# Patient Record
Sex: Male | Born: 1975 | Race: White | Hispanic: No | Marital: Single | Smoking: Never smoker
Health system: Southern US, Community
[De-identification: ages and names within clinical notes are randomized; demographics above are authoritative.]

## PROBLEM LIST (undated history)

## (undated) ENCOUNTER — Ambulatory Visit (HOSPITAL_COMMUNITY): Payer: BC Managed Care – PPO

## (undated) DIAGNOSIS — G4730 Sleep apnea, unspecified: Secondary | ICD-10-CM

## (undated) DIAGNOSIS — I10 Essential (primary) hypertension: Secondary | ICD-10-CM

## (undated) DIAGNOSIS — Z5189 Encounter for other specified aftercare: Secondary | ICD-10-CM

## (undated) HISTORY — PX: ROTATOR CUFF REPAIR: SHX139

---

## 2005-09-27 ENCOUNTER — Emergency Department (HOSPITAL_COMMUNITY): Admission: EM | Admit: 2005-09-27 | Discharge: 2005-09-28 | Payer: Self-pay | Admitting: Emergency Medicine

## 2007-12-25 ENCOUNTER — Emergency Department (HOSPITAL_COMMUNITY): Admission: EM | Admit: 2007-12-25 | Discharge: 2007-12-25 | Payer: Self-pay | Admitting: Emergency Medicine

## 2009-03-31 ENCOUNTER — Emergency Department (HOSPITAL_COMMUNITY): Admission: EM | Admit: 2009-03-31 | Discharge: 2009-03-31 | Payer: Self-pay | Admitting: Emergency Medicine

## 2009-09-23 IMAGING — CR DG CHEST 2V
2 series · 2 of 2 positions shown · non-contrast
Comparison: None

CLINICAL DATA: Chest tightness for 1 week.  Hypertension.  Chest
pain.

CHEST - 2 VIEW

[view not recorded (1 of 2)]
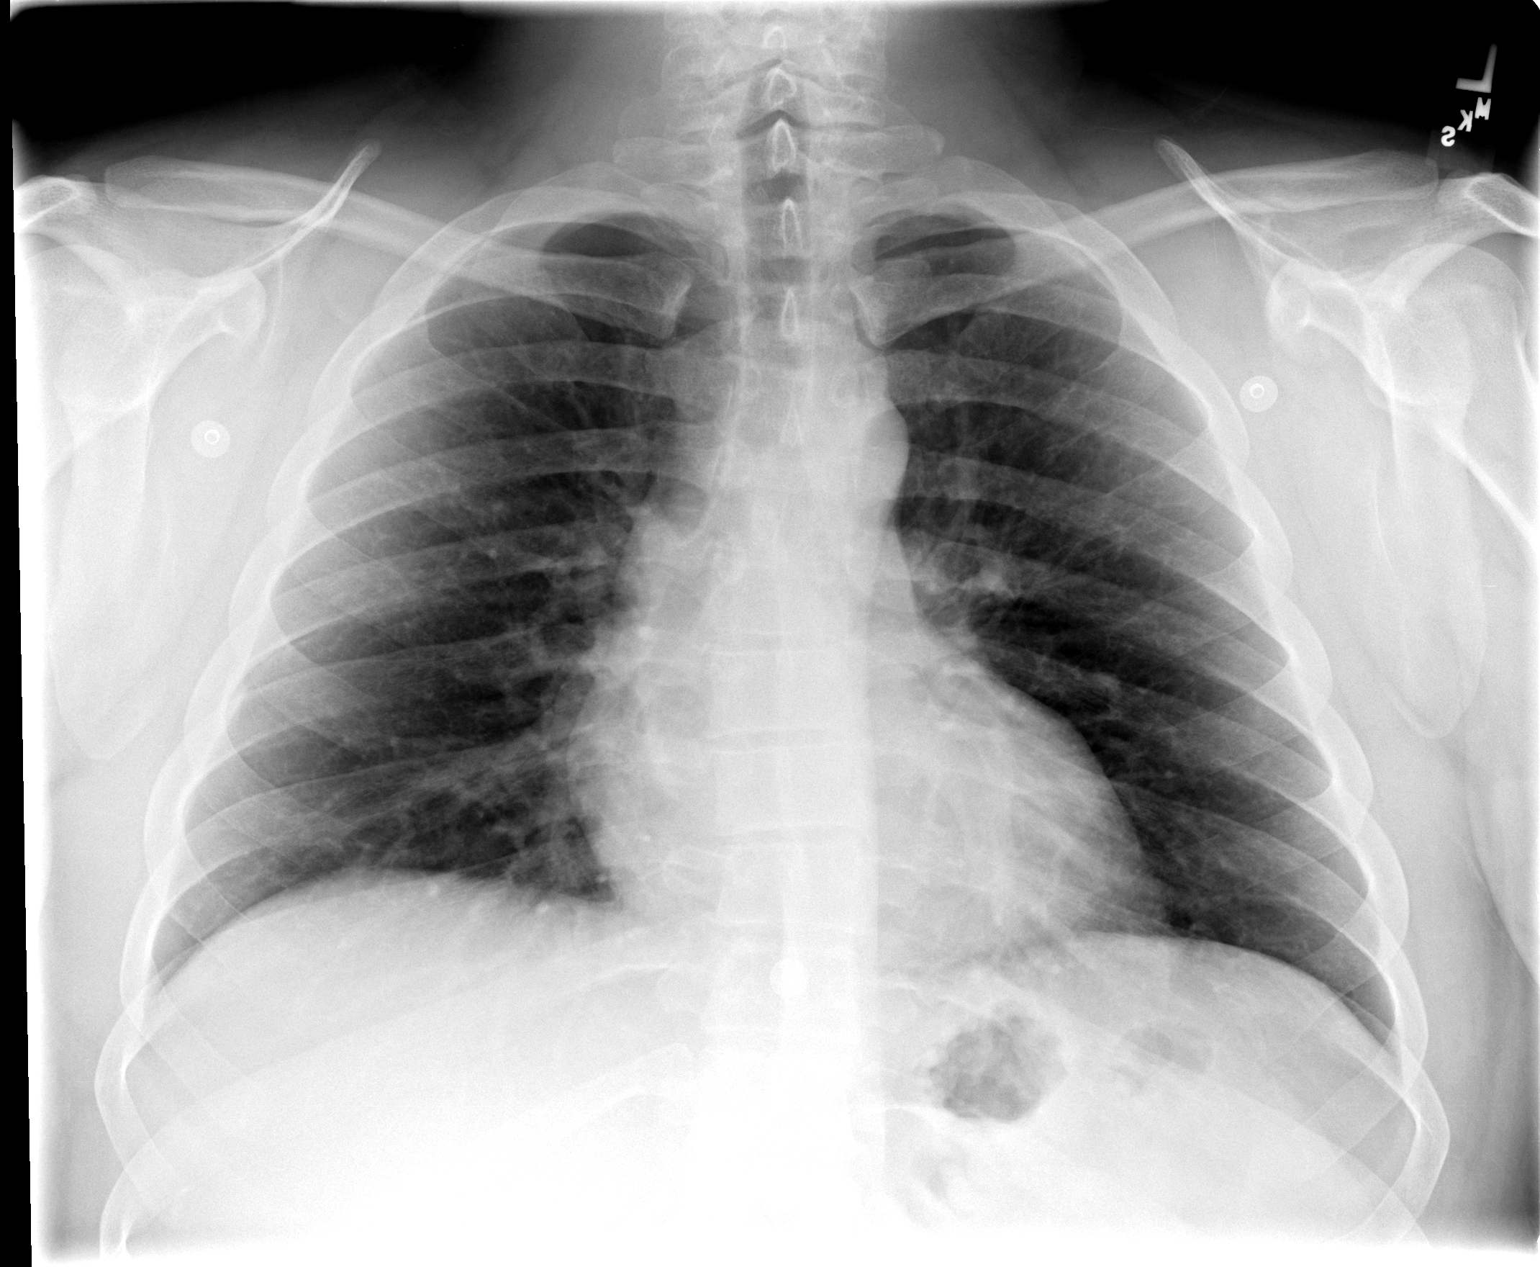

[view not recorded (2 of 2)]
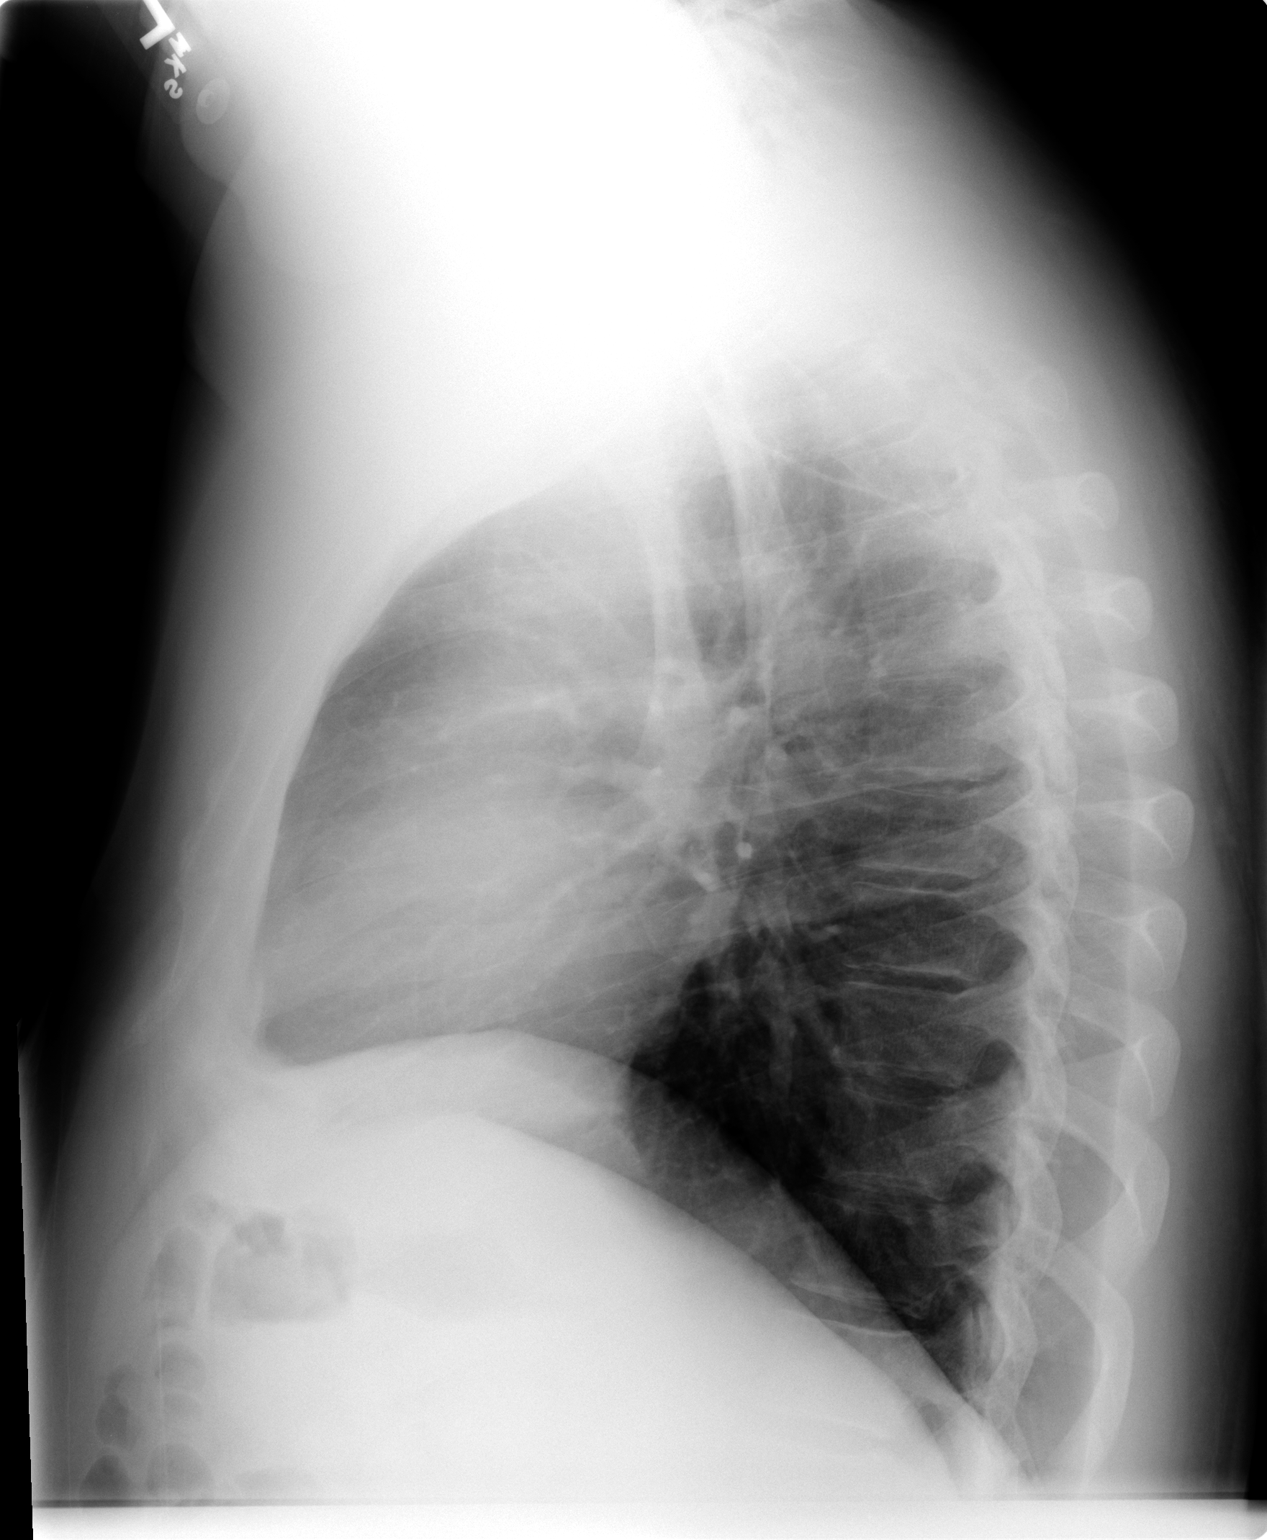

[2 of 2 positions shown; findings below may reference images not displayed]

FINDINGS: Mild airway thickening suggest bronchitis or reactive
airways disease.

Cardiac and mediastinal contours appear unremarkable.

No airspace opacity is identified.
IMPRESSION: 1. Airway thickening may reflect bronchitis or reactive airways
disease.  No airspace opacity is identified to suggest bacterial
pneumonia pattern.

## 2009-10-26 ENCOUNTER — Encounter: Admission: RE | Admit: 2009-10-26 | Discharge: 2009-10-26 | Payer: Self-pay | Admitting: Emergency Medicine

## 2010-01-29 ENCOUNTER — Ambulatory Visit (HOSPITAL_BASED_OUTPATIENT_CLINIC_OR_DEPARTMENT_OTHER): Admission: RE | Admit: 2010-01-29 | Discharge: 2010-01-29 | Payer: Self-pay | Admitting: Urology

## 2010-05-25 LAB — POCT HEMOGLOBIN-HEMACUE: Hemoglobin: 15.3 g/dL (ref 13.0–17.0)

## 2010-12-13 LAB — DIFFERENTIAL
Basophils Absolute: 0
Eosinophils Absolute: 0
Neutro Abs: 6.1
Neutrophils Relative %: 66

## 2010-12-13 LAB — CBC
HCT: 45.3
Hemoglobin: 15.8
MCHC: 34.8
MCV: 86.1
Platelets: 288
RBC: 5.27
RDW: 12.9

## 2010-12-13 LAB — BASIC METABOLIC PANEL
BUN: 8
CO2: 26
Chloride: 102
Creatinine, Ser: 1.06

## 2010-12-13 LAB — POCT CARDIAC MARKERS
CKMB, poc: 1.7
Myoglobin, poc: 67.4
Troponin i, poc: <0.05
Troponin i, poc: <0.05
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
## 2015-07-06 ENCOUNTER — Other Ambulatory Visit: Payer: Self-pay | Admitting: Sports Medicine

## 2015-07-06 DIAGNOSIS — M25512 Pain in left shoulder: Secondary | ICD-10-CM

## 2015-07-06 DIAGNOSIS — Z77018 Contact with and (suspected) exposure to other hazardous metals: Secondary | ICD-10-CM

## 2015-07-14 ENCOUNTER — Ambulatory Visit
Admission: RE | Admit: 2015-07-14 | Discharge: 2015-07-14 | Disposition: A | Discharge status: Home / Self Care | Payer: BC Managed Care – PPO | Source: Ambulatory Visit | Attending: Sports Medicine | Admitting: Sports Medicine

## 2015-07-14 DIAGNOSIS — M25512 Pain in left shoulder: Secondary | ICD-10-CM

## 2015-07-14 DIAGNOSIS — Z77018 Contact with and (suspected) exposure to other hazardous metals: Secondary | ICD-10-CM

## 2015-07-20 ENCOUNTER — Other Ambulatory Visit: Payer: Self-pay | Admitting: Sports Medicine

## 2015-07-20 ENCOUNTER — Ambulatory Visit
Admission: RE | Admit: 2015-07-20 | Discharge: 2015-07-20 | Disposition: A | Discharge status: Home / Self Care | Payer: BC Managed Care – PPO | Source: Ambulatory Visit | Attending: Sports Medicine | Admitting: Sports Medicine

## 2015-07-20 ENCOUNTER — Ambulatory Visit: Admission: RE | Admit: 2015-07-20 | Payer: BC Managed Care – PPO | Source: Ambulatory Visit

## 2015-07-20 DIAGNOSIS — M25512 Pain in left shoulder: Secondary | ICD-10-CM

## 2015-12-08 ENCOUNTER — Ambulatory Visit (INDEPENDENT_AMBULATORY_CARE_PROVIDER_SITE_OTHER): Payer: BC Managed Care – PPO | Admitting: Urology

## 2015-12-08 DIAGNOSIS — F524 Premature ejaculation: Secondary | ICD-10-CM

## 2015-12-08 DIAGNOSIS — R102 Pelvic and perineal pain: Secondary | ICD-10-CM | POA: Diagnosis not present

## 2018-03-14 HISTORY — PX: ARTHOSCOPIC ROTAOR CUFF REPAIR: SHX5002

## 2018-11-15 ENCOUNTER — Other Ambulatory Visit: Payer: Self-pay | Admitting: Orthopedic Surgery

## 2018-11-21 ENCOUNTER — Other Ambulatory Visit: Payer: Self-pay | Admitting: Orthopedic Surgery

## 2018-11-21 ENCOUNTER — Ambulatory Visit
Admission: RE | Admit: 2018-11-21 | Discharge: 2018-11-21 | Disposition: A | Discharge status: Home / Self Care | Payer: No Typology Code available for payment source | Source: Ambulatory Visit | Attending: Orthopedic Surgery | Admitting: Orthopedic Surgery

## 2018-11-21 DIAGNOSIS — G8929 Other chronic pain: Secondary | ICD-10-CM

## 2018-11-21 DIAGNOSIS — T1590XA Foreign body on external eye, part unspecified, unspecified eye, initial encounter: Secondary | ICD-10-CM

## 2018-11-24 ENCOUNTER — Ambulatory Visit
Admission: RE | Admit: 2018-11-24 | Discharge: 2018-11-24 | Disposition: A | Discharge status: Home / Self Care | Payer: BC Managed Care – PPO | Source: Ambulatory Visit | Attending: Orthopedic Surgery | Admitting: Orthopedic Surgery

## 2018-11-24 ENCOUNTER — Other Ambulatory Visit: Payer: Self-pay

## 2018-11-24 DIAGNOSIS — G8929 Other chronic pain: Secondary | ICD-10-CM

## 2019-01-08 ENCOUNTER — Other Ambulatory Visit (HOSPITAL_BASED_OUTPATIENT_CLINIC_OR_DEPARTMENT_OTHER): Payer: Self-pay

## 2019-01-08 DIAGNOSIS — R0683 Snoring: Secondary | ICD-10-CM

## 2019-01-08 DIAGNOSIS — R5383 Other fatigue: Secondary | ICD-10-CM

## 2019-01-11 ENCOUNTER — Other Ambulatory Visit: Payer: Self-pay

## 2019-01-11 ENCOUNTER — Other Ambulatory Visit (HOSPITAL_COMMUNITY)
Admission: RE | Admit: 2019-01-11 | Discharge: 2019-01-11 | Disposition: A | Discharge status: Home / Self Care | Payer: BC Managed Care – PPO | Source: Ambulatory Visit | Attending: Neurology | Admitting: Neurology

## 2019-01-11 DIAGNOSIS — Z01812 Encounter for preprocedural laboratory examination: Secondary | ICD-10-CM | POA: Diagnosis not present

## 2019-01-11 DIAGNOSIS — Z20828 Contact with and (suspected) exposure to other viral communicable diseases: Secondary | ICD-10-CM | POA: Insufficient documentation

## 2019-01-11 LAB — SARS CORONAVIRUS 2 (TAT 6-24 HRS): SARS Coronavirus 2: NEGATIVE

## 2019-01-14 ENCOUNTER — Other Ambulatory Visit: Payer: Self-pay

## 2019-01-14 ENCOUNTER — Ambulatory Visit: Payer: No Typology Code available for payment source | Attending: *Deleted | Admitting: Neurology

## 2019-01-14 DIAGNOSIS — R5383 Other fatigue: Secondary | ICD-10-CM

## 2019-01-14 DIAGNOSIS — R0683 Snoring: Secondary | ICD-10-CM

## 2019-01-14 DIAGNOSIS — G4733 Obstructive sleep apnea (adult) (pediatric): Secondary | ICD-10-CM | POA: Diagnosis not present

## 2019-01-17 NOTE — Procedures (Signed)
  [place] A. Merlene Laughter, MD     [email or web address]             NOCTURNAL POLYSOMNOGRAPHY   LOCATION: [facility]  Patient Name: Dennis, Perez Date: 01/14/2019 Gender: Male D.O.B: 12-Aug-1975 Age (years): 27 Referring Provider: Everardo Beals NP Height (inches): 66 Interpreting Physician: Phillips Odor MD, ABSM Weight (lbs): 310 RPSGT: Rosebud Poles BMI: 50 MRN: [id number] Neck Size: 21.00 CLINICAL INFORMATION  Sleep Study Type: NPSG     Indication for sleep study: Fatigue, Snoring     Epworth Sleepiness Score: 15    SLEEP STUDY TECHNIQUE  As per the AASM Manual for the Scoring of Sleep and Associated Events v2.3 (April 2016) with a hypopnea requiring 4% desaturations.  The channels recorded and monitored were frontal, central and occipital EEG, electrooculogram (EOG), submentalis EMG (chin), nasal and oral airflow, thoracic and abdominal wall motion, anterior tibialis EMG, snore microphone, electrocardiogram, and pulse oximetry.   MEDICATIONS Medications self-administered by patient taken the night of the study : N/A No current outpatient medications on file.    SLEEP ARCHITECTURE  The study was initiated at 10:25:50 PM and ended at 4:54:24 AM.  Sleep onset time was 33.2 minutes and the sleep efficiency was 79.0%%. The total sleep time was 307 minutes.  Stage REM latency was 103.5 minutes.  The patient spent 7.8%% of the night in stage N1 sleep, 51.5%% in stage N2 sleep, 23.6%% in stage N3 and 17.1% in REM.  Alpha intrusion was absent.  Supine sleep was 70.71%. RESPIRATORY PARAMETERS  The overall apnea/hypopnea index (AHI) was 15.4 per hour. There were 0 total apneas, including 0 obstructive, 0 central and 0 mixed apneas. There were 79 hypopneas and 0 RERAs.  The AHI during Stage REM sleep was 46.9 per hour.  AHI while supine was 20.5 per hour.  The mean oxygen saturation was 93.4%. The minimum SpO2 during sleep was  71.0%.  loud snoring was noted during this study.   CARDIAC DATA The 2 lead EKG demonstrated sinus rhythm. The mean heart rate was 70.3 beats per minute. Other EKG findings include: None.  LEG MOVEMENT DATA The total PLMS were 13 with a resulting PLMS index of 2.5. Associated arousal with leg movement index was 2.7.   IMPRESSIONS 1.  Severe obstructive sleep apnea syndrome is documented with this study.  AutoPap 10-20 is recommended.  Delano Metz, MD Diplomate, American Board of Sleep Medicine.  ELECTRONICALLY SIGNED ON:  01/17/2019, 11:13 PM [place] PH: [phone]   FX: [phone] [place]

## 2021-03-08 ENCOUNTER — Ambulatory Visit
Admission: RE | Admit: 2021-03-08 | Discharge: 2021-03-08 | Disposition: A | Discharge status: Home / Self Care | Payer: BC Managed Care – PPO | Source: Ambulatory Visit

## 2021-03-08 ENCOUNTER — Other Ambulatory Visit: Payer: Self-pay

## 2021-03-08 VITALS — BP 166/105 | HR 72 | Temp 97.7°F | Resp 20

## 2021-03-08 DIAGNOSIS — H6983 Other specified disorders of Eustachian tube, bilateral: Secondary | ICD-10-CM | POA: Diagnosis not present

## 2021-03-08 DIAGNOSIS — J3489 Other specified disorders of nose and nasal sinuses: Secondary | ICD-10-CM

## 2021-03-08 DIAGNOSIS — J3089 Other allergic rhinitis: Secondary | ICD-10-CM

## 2021-03-08 DIAGNOSIS — R0981 Nasal congestion: Secondary | ICD-10-CM

## 2021-03-08 HISTORY — DX: Encounter for other specified aftercare: Z51.89

## 2021-03-08 MED ORDER — CETIRIZINE HCL 10 MG PO TABS: 10.0000 mg | ORAL_TABLET | Freq: Every day | ORAL | 2 refills | Status: AC

## 2021-03-08 MED ORDER — DEXAMETHASONE SODIUM PHOSPHATE 10 MG/ML IJ SOLN
10.0000 mg | Freq: Once | INTRAMUSCULAR | Status: AC
  Administered 2021-03-08: 18:00:00 10 mg via INTRAMUSCULAR

## 2021-03-08 MED ORDER — FLUTICASONE PROPIONATE 50 MCG/ACT NA SUSP: 1.0000 | Freq: Two times a day (BID) | NASAL | 2 refills | Status: AC

## 2021-03-08 NOTE — ED Provider Notes (Signed)
[facility]    CSN: [id number] Arrival date & time: 03/08/21  1647      History   Chief Complaint Chief Complaint  Patient presents with   Nasal Congestion   Facial Pain   Cough    HPI Dennis Perez is a 45 y.o. male.   Presenting today with 3 to 4-day history of sinus pressure, runny nose, itchy watery eyes, cough, postnasal drip, ear pressure and fullness.  Denies fever, chills, chest pain, shortness of breath, abdominal pain, nausea vomiting or diarrhea.  States he gets something like this every year around this time.  No known sick contacts recently.  Past Medical History:  Diagnosis Date   Blood transfusion without reported diagnosis     There are no problems to display for this patient.   History reviewed. No pertinent surgical history.   Home Medications    Prior to Admission medications   Medication Sig Start Date End Date Taking? Authorizing Provider  cetirizine (ZYRTEC ALLERGY) 10 MG tablet Take 1 tablet (10 mg total) by mouth daily. 03/08/21  Yes Particia Nearing, PA-C  fluticasone [facility]) 50 MCG/ACT nasal spray Place 1 spray into both nostrils 2 (two) times daily. 03/08/21  Yes Particia Nearing, PA-C  lisinopril (ZESTRIL) 10 MG tablet lisinopril 10 mg tablet    [provider]    Family History Family History  Family history unknown: Yes    Social History Social History   Tobacco Use   Smoking status: Never   Smokeless tobacco: Never  Substance Use Topics   Alcohol use: Not Currently     Allergies   Penicillins   Review of Systems Review of Systems Per HPI  Physical Exam Triage Vital Signs ED Triage Vitals  Enc Vitals Group     BP 03/08/21 1730 (!) 166/105     Pulse Rate 03/08/21 1730 72     Resp 03/08/21 1730 20     Temp 03/08/21 1730 97.7 F (36.5 C)     Temp src --      SpO2 03/08/21 1730 97 %     Weight --      Height --      Head Circumference --      Peak Flow --      Pain Score  03/08/21 1727 3     Pain Loc --      Pain Edu? --      Excl. in GC? --    No data found.  Updated Vital Signs BP (!) 166/105    Pulse 72    Temp 97.7 F (36.5 C)    Resp 20    SpO2 97%   Visual Acuity Right Eye Distance:   Left Eye Distance:   Bilateral Distance:    Right Eye Near:   Left Eye Near:    Bilateral Near:     Physical Exam Vitals and nursing note reviewed.  Constitutional:      Appearance: He is well-developed.  HENT:     Head: Atraumatic.     Right Ear: External ear normal.     Left Ear: External ear normal.     Ears:     Comments: Bilateral middle ear effusion    Nose: Rhinorrhea present.     Mouth/Throat:     Pharynx: Posterior oropharyngeal erythema present. No oropharyngeal exudate.  Eyes:     Conjunctiva/sclera: Conjunctivae normal.     Pupils: Pupils are equal, round, and reactive to light.  Cardiovascular:  Rate and Rhythm: Normal rate and regular rhythm.  Pulmonary:     Effort: Pulmonary effort is normal. No respiratory distress.     Breath sounds: No wheezing or rales.  Musculoskeletal:        General: Normal range of motion.     Cervical back: Normal range of motion and neck supple.  Lymphadenopathy:     Cervical: No cervical adenopathy.  Skin:    General: Skin is warm and dry.  Neurological:     Mental Status: He is alert and oriented to person, place, and time.  Psychiatric:        Behavior: Behavior normal.     UC Treatments / Results  Labs (all labs ordered are listed, but only abnormal results are displayed) Labs Reviewed - No data to display  EKG   Radiology No results found.  Procedures Procedures (including critical care time)  Medications Ordered in UC Medications  dexamethasone (DECADRON) injection 10 mg (has no administration in time range)    Initial Impression / Assessment and Plan / UC Course  I have reviewed the triage vital signs and the nursing notes.  Pertinent labs & imaging results that were  available during my care of the patient were reviewed by me and considered in my medical decision making (see chart for details).     Suspect allergic sinusitis.  We will treat with IM Decadron, start good allergy regimen with Zyrtec and Flonase.  Discussed supportive over-the-counter medications and home care.  Return for acutely worsening symptoms.  Final Clinical Impressions(s) / UC Diagnoses   Final diagnoses:  Seasonal allergic rhinitis due to other allergic trigger  Sinus pressure  Nasal congestion  Eustachian tube dysfunction, bilateral   Discharge Instructions   None    ED Prescriptions     Medication Sig Dispense Auth. Provider   fluticasone (FLONASE) 50 MCG/ACT nasal spray Place 1 spray into both nostrils 2 (two) times daily. 16 g Particia Nearing, [place]   cetirizine (ZYRTEC ALLERGY) 10 MG tablet Take 1 tablet (10 mg total) by mouth daily. 30 tablet Particia Nearing, [place]      PDMP not reviewed this encounter.   Particia Nearing, [place] 03/08/21 1756

## 2021-03-08 NOTE — ED Triage Notes (Signed)
Pt presents with c/o nasal congestion and cough that began Thursday also has has c/o ear fullness

## 2022-12-27 ENCOUNTER — Ambulatory Visit: Payer: Self-pay

## 2022-12-27 ENCOUNTER — Other Ambulatory Visit: Payer: Self-pay | Admitting: Family Medicine

## 2022-12-27 DIAGNOSIS — M25572 Pain in left ankle and joints of left foot: Secondary | ICD-10-CM

## 2022-12-27 DIAGNOSIS — M79672 Pain in left foot: Secondary | ICD-10-CM

## 2023-07-05 ENCOUNTER — Other Ambulatory Visit: Payer: Self-pay

## 2023-07-05 ENCOUNTER — Encounter (HOSPITAL_BASED_OUTPATIENT_CLINIC_OR_DEPARTMENT_OTHER): Payer: Self-pay | Admitting: Orthopaedic Surgery

## 2023-07-07 ENCOUNTER — Encounter (HOSPITAL_BASED_OUTPATIENT_CLINIC_OR_DEPARTMENT_OTHER)
Admission: RE | Admit: 2023-07-07 | Discharge: 2023-07-07 | Disposition: A | Discharge status: Home / Self Care | Payer: Self-pay | Source: Ambulatory Visit | Attending: Orthopaedic Surgery | Admitting: Orthopaedic Surgery

## 2023-07-07 DIAGNOSIS — Z01818 Encounter for other preprocedural examination: Secondary | ICD-10-CM | POA: Diagnosis present

## 2023-07-07 DIAGNOSIS — I10 Essential (primary) hypertension: Secondary | ICD-10-CM | POA: Diagnosis not present

## 2023-07-07 LAB — BASIC METABOLIC PANEL WITH GFR
Anion gap: 8 (ref 5–15)
BUN: 9 mg/dL (ref 6–20)
CO2: 23 mmol/L (ref 22–32)
Calcium: 8.7 mg/dL — ABNORMAL LOW (ref 8.9–10.3)
Chloride: 105 mmol/L (ref 98–111)
Creatinine, Ser: 0.91 mg/dL (ref 0.61–1.24)
GFR, Estimated: >60 mL/min (ref >60)
Glucose, Bld: 110 mg/dL — ABNORMAL HIGH (ref 70–99)
Potassium: 4.2 mmol/L (ref 3.5–5.1)
Sodium: 136 mmol/L (ref 135–145)

## 2023-07-09 NOTE — H&P (Signed)
 ORTHOPAEDIC SURGERY H&P  Subjective:  The patient presents with left ankle instability.   Past Medical History:  Diagnosis Date   Blood transfusion without reported diagnosis    Hypertension    Sleep apnea     Past Surgical History:  Procedure Laterality Date   ARTHOSCOPIC ROTAOR CUFF REPAIR Right 2020   ROTATOR CUFF REPAIR Left      (Not in an outpatient encounter)    Allergies  Allergen Reactions   Latex    Penicillins     Social History   Socioeconomic History   Marital status: Single    Spouse name: Not on file   Number of children: Not on file   Years of education: Not on file   Highest education level: Not on file  Occupational History   Not on file  Tobacco Use   Smoking status: Never   Smokeless tobacco: Never  Substance and Sexual Activity   Alcohol use: Not Currently    Comment: occ   Drug use: Never   Sexual activity: Yes  Other Topics Concern   Not on file  Social History Narrative   Not on file   Social Drivers of Health   Financial Resource Strain: Not on file  Food Insecurity: Not on file  Transportation Needs: Not on file  Physical Activity: Not on file  Stress: Not on file  Social Connections: Not on file  Intimate Partner Violence: Not on file     History reviewed. No pertinent family history.   Review of Systems Pertinent items are noted in HPI.  Objective: Vital signs in last 24 hours:    07/07/2023    7:30 AM 07/05/2023    4:16 PM 03/08/2021    5:30 PM  Vitals with BMI  Height  5\' 8"    Weight  180 lbs   BMI  27.38   Systolic 154  166  Diastolic 99  105  Pulse   72      EXAM: General: Well nourished, well developed. Awake, alert and oriented to time, place, person. Normal mood and affect. No apparent distress. Breathing room air.  Operative Lower Extremity: Alignment - Neutral Deformity - None Skin intact Tenderness to palpation - left ankle 5/5 TA, PT, GS, Per, EHL, FHL Sensation intact to light touch  throughout Palpable DP and PT pulses Special testing: None  The contralateral foot/ankle was examined for comparison and noted to be neurovascularly intact with no localized deformity, swelling, or tenderness.  Imaging Review All images taken were independently reviewed by me.  Assessment/Plan: The clinical and radiographic findings were reviewed and discussed at length with the patient.  The patient has left ankle instability.  We spoke at length about the natural course of these findings. We discussed nonoperative and operative treatment options in detail.  The risks and benefits were presented and reviewed. The risks due to hardware failure/irritation, new/persistent/recurrent infection, stiffness, nerve/vessel/tendon injury, nonunion/malunion of any fracture, wound healing issues, allograft usage, development of arthritis, failure of this surgery, possibility of external fixation in certain situations, possibility of delayed definitive surgery, need for further surgery, prolonged wound care including further soft tissue coverage procedures, thromboembolic events, anesthesia/medical complications/events perioperatively and beyond, amputation, death among others were discussed. The patient acknowledged the explanation and agreed to proceed with the plan.  Dennis Perez  Orthopaedic Surgery [facility]

## 2023-07-09 NOTE — Discharge Instructions (Signed)
 Netta Cedars, MD [facility]  Please read the following information regarding your care after surgery.  Medications  You only need a prescription for the narcotic pain medicine (ex. oxycodone, Percocet, Norco).  All of the other medicines listed below are available over the counter. ? Aleve 2 pills twice a day for the first 3 days after surgery. ? acetominophen (Tylenol) 650 mg every 4-6 hours as you need for minor to moderate pain ? oxycodone as prescribed for severe pain  ? To help prevent blood clots, take aspirin (81 mg) twice daily for at least 42 days after surgery.  You should also get up every hour while you are awake to move around.  Weight Bearing ? Do NOT bear any weight on the operated leg or foot. This means do NOT touch your surgical leg to the ground!  Cast / Splint / Dressing ? If you have a splint, do NOT remove this. Keep your splint, cast or dressing clean and dry.  Don't put anything (coat hanger, pencil, etc) down inside of it.  If it gets wet, call the office immediately to schedule an appointment for a cast change.  Swelling IMPORTANT: It is normal for you to have swelling where you had surgery. To reduce swelling and pain, keep at least 3 pillows under your leg so that your toes are above your nose and your heel is above the level of your hip.  It may be necessary to keep your foot or leg elevated for several weeks.  This is critical to helping your incisions heal and your pain to feel better.  Follow Up Call my office at [phone] when you are discharged from the hospital or surgery center to schedule an appointment to be seen 7-10 days after surgery.  Call my office at [phone] if you develop a fever >101.5 F, nausea, vomiting, bleeding from the surgical site or severe pain.     Post Anesthesia Home Care Instructions  Activity: Get plenty of rest for the remainder of the day. A responsible individual must stay with you for 24 hours following the  procedure.  For the next 24 hours, DO NOT: -Drive a car -Advertising copywriter -Drink alcoholic beverages -Take any medication unless instructed by your physician -Make any legal decisions or sign important papers.  Meals: Start with liquid foods such as gelatin or soup. Progress to regular foods as tolerated. Avoid greasy, spicy, heavy foods. If nausea and/or vomiting occur, drink only clear liquids until the nausea and/or vomiting subsides. Call your physician if vomiting continues.  Special Instructions/Symptoms: Your throat may feel dry or sore from the anesthesia or the breathing tube placed in your throat during surgery. If this causes discomfort, gargle with warm salt water. The discomfort should disappear within 24 hours.  If you had a scopolamine patch placed behind your ear for the management of post- operative nausea and/or vomiting:  1. The medication in the patch is effective for 72 hours, after which it should be removed.  Wrap patch in a tissue and discard in the trash. Wash hands thoroughly with soap and water. 2. You may remove the patch earlier than 72 hours if you experience unpleasant side effects which may include dry mouth, dizziness or visual disturbances. 3. Avoid touching the patch. Wash your hands with soap and water after contact with the patch.  Regional Anesthesia Blocks  1. You may not be able to move or feel the "blocked" extremity after a regional anesthetic block. This may last may last  from 3-48 hours after placement, but it will go away. The length of time depends on the medication injected and your individual response to the medication. As the nerves start to wake up, you may experience tingling as the movement and feeling returns to your extremity. If the numbness and inability to move your extremity has not gone away after 48 hours, please call your surgeon.   2. The extremity that is blocked will need to be protected until the numbness is gone and the  strength has returned. Because you cannot feel it, you will need to take extra care to avoid injury. Because it may be weak, you may have difficulty moving it or using it. You may not know what position it is in without looking at it while the block is in effect.  3. For blocks in the legs and feet, returning to weight bearing and walking needs to be done carefully. You will need to wait until the numbness is entirely gone and the strength has returned. You should be able to move your leg and foot normally before you try and bear weight or walk. You will need someone to be with you when you first try to ensure you do not fall and possibly risk injury.  4. Bruising and tenderness at the needle site are common side effects and will resolve in a few days.  5. Persistent numbness or new problems with movement should be communicated to the surgeon or the [facility] [phone] [facility] [phone]).

## 2023-07-12 ENCOUNTER — Encounter (HOSPITAL_BASED_OUTPATIENT_CLINIC_OR_DEPARTMENT_OTHER)
Admission: RE | Disposition: A | Discharge status: Home / Self Care | Payer: Self-pay | Source: Home / Self Care | Attending: Orthopaedic Surgery

## 2023-07-12 ENCOUNTER — Other Ambulatory Visit: Payer: Self-pay

## 2023-07-12 ENCOUNTER — Encounter (HOSPITAL_BASED_OUTPATIENT_CLINIC_OR_DEPARTMENT_OTHER): Payer: Self-pay | Admitting: Orthopaedic Surgery

## 2023-07-12 ENCOUNTER — Ambulatory Visit (HOSPITAL_BASED_OUTPATIENT_CLINIC_OR_DEPARTMENT_OTHER)

## 2023-07-12 ENCOUNTER — Ambulatory Visit (HOSPITAL_BASED_OUTPATIENT_CLINIC_OR_DEPARTMENT_OTHER)
Admission: RE | Admit: 2023-07-12 | Discharge: 2023-07-12 | Disposition: A | Discharge status: Home / Self Care | Attending: Orthopaedic Surgery | Admitting: Orthopaedic Surgery

## 2023-07-12 ENCOUNTER — Ambulatory Visit (HOSPITAL_BASED_OUTPATIENT_CLINIC_OR_DEPARTMENT_OTHER): Admitting: Certified Registered Nurse Anesthetist

## 2023-07-12 DIAGNOSIS — S93492A Sprain of other ligament of left ankle, initial encounter: Secondary | ICD-10-CM | POA: Diagnosis not present

## 2023-07-12 DIAGNOSIS — Z6841 Body Mass Index (BMI) 40.0 and over, adult: Secondary | ICD-10-CM | POA: Insufficient documentation

## 2023-07-12 DIAGNOSIS — M24473 Recurrent dislocation, unspecified ankle: Secondary | ICD-10-CM | POA: Diagnosis not present

## 2023-07-12 DIAGNOSIS — I10 Essential (primary) hypertension: Secondary | ICD-10-CM

## 2023-07-12 DIAGNOSIS — E66813 Obesity, class 3: Secondary | ICD-10-CM | POA: Diagnosis not present

## 2023-07-12 DIAGNOSIS — M25372 Other instability, left ankle: Secondary | ICD-10-CM | POA: Diagnosis present

## 2023-07-12 DIAGNOSIS — G4730 Sleep apnea, unspecified: Secondary | ICD-10-CM | POA: Diagnosis not present

## 2023-07-12 HISTORY — PX: FLEXOR TENDON REPAIR: SHX6501

## 2023-07-12 HISTORY — DX: Sleep apnea, unspecified: G47.30

## 2023-07-12 HISTORY — PX: ARTHROSCOPY, ANKLE WITH DEBRIDEMENT: SHX7318

## 2023-07-12 HISTORY — DX: Essential (primary) hypertension: I10

## 2023-07-12 HISTORY — PX: SYNDESMOSIS REPAIR: SHX5182

## 2023-07-12 SURGERY — ARTHROSCOPY, ANKLE WITH DEBRIDEMENT
Anesthesia: General | Site: Ankle | Laterality: Left

## 2023-07-12 MED ORDER — ONDANSETRON HCL 4 MG/2ML IJ SOLN
INTRAMUSCULAR | Status: DC | PRN
  Administered 2023-07-12: 4 mg via INTRAVENOUS

## 2023-07-12 MED ORDER — LIDOCAINE HCL (CARDIAC) PF 100 MG/5ML IV SOSY
PREFILLED_SYRINGE | INTRAVENOUS | Status: DC | PRN
  Administered 2023-07-12: 50 mg via INTRAVENOUS

## 2023-07-12 MED ORDER — CEFAZOLIN SODIUM-DEXTROSE 2-4 GM/100ML-% IV SOLN
2.0000 g | INTRAVENOUS | Status: AC
  Administered 2023-07-12: 3 g via INTRAVENOUS

## 2023-07-12 MED ORDER — PROPOFOL 10 MG/ML IV BOLUS
INTRAVENOUS | Status: DC | PRN
  Administered 2023-07-12: 200 mg via INTRAVENOUS

## 2023-07-12 MED ORDER — FENTANYL CITRATE (PF) 100 MCG/2ML IJ SOLN
100.0000 ug | Freq: Once | INTRAMUSCULAR | Status: AC
  Administered 2023-07-12: 100 ug via INTRAVENOUS

## 2023-07-12 MED ORDER — PROPOFOL 10 MG/ML IV BOLUS
INTRAVENOUS | Status: AC
  Filled 2023-07-12: qty 20

## 2023-07-12 MED ORDER — OXYCODONE HCL 5 MG PO TABS
ORAL_TABLET | ORAL | Status: AC
  Filled 2023-07-12: qty 1

## 2023-07-12 MED ORDER — AMISULPRIDE (ANTIEMETIC) 5 MG/2ML IV SOLN: 10.0000 mg | Freq: Once | INTRAVENOUS | Status: DC | PRN

## 2023-07-12 MED ORDER — HYDROMORPHONE HCL 1 MG/ML IJ SOLN
0.2500 mg | INTRAMUSCULAR | Status: DC | PRN
Start: 2023-07-12 — End: 2023-07-12
  Administered 2023-07-12: 0.5 mg via INTRAVENOUS

## 2023-07-12 MED ORDER — CEFAZOLIN SODIUM-DEXTROSE 3-4 GM/150ML-% IV SOLN
INTRAVENOUS | Status: AC
  Filled 2023-07-12: qty 150

## 2023-07-12 MED ORDER — MIDAZOLAM HCL 2 MG/2ML IJ SOLN
2.0000 mg | Freq: Once | INTRAMUSCULAR | Status: AC
  Administered 2023-07-12: 2 mg via INTRAVENOUS

## 2023-07-12 MED ORDER — OXYCODONE HCL 5 MG/5ML PO SOLN: 5.0000 mg | Freq: Once | ORAL | Status: AC | PRN

## 2023-07-12 MED ORDER — FENTANYL CITRATE (PF) 100 MCG/2ML IJ SOLN
INTRAMUSCULAR | Status: DC | PRN
  Administered 2023-07-12: 50 ug via INTRAVENOUS

## 2023-07-12 MED ORDER — FENTANYL CITRATE (PF) 100 MCG/2ML IJ SOLN
INTRAMUSCULAR | Status: AC
  Filled 2023-07-12: qty 2

## 2023-07-12 MED ORDER — VANCOMYCIN HCL 500 MG IV SOLR
INTRAVENOUS | Status: DC | PRN
  Administered 2023-07-12: 500 mg

## 2023-07-12 MED ORDER — ROPIVACAINE HCL 5 MG/ML IJ SOLN
INTRAMUSCULAR | Status: DC | PRN
Start: 2023-07-12 — End: 2023-07-12
  Administered 2023-07-12: 30 mL via PERINEURAL

## 2023-07-12 MED ORDER — LIDOCAINE 2% (20 MG/ML) 5 ML SYRINGE
INTRAMUSCULAR | Status: AC
  Filled 2023-07-12: qty 5

## 2023-07-12 MED ORDER — DEXAMETHASONE SODIUM PHOSPHATE 10 MG/ML IJ SOLN
INTRAMUSCULAR | Status: AC
  Filled 2023-07-12: qty 1

## 2023-07-12 MED ORDER — SODIUM CHLORIDE 0.9 % IR SOLN
Status: DC | PRN
  Administered 2023-07-12: 1000 mL

## 2023-07-12 MED ORDER — MIDAZOLAM HCL 5 MG/5ML IJ SOLN
INTRAMUSCULAR | Status: DC | PRN
  Administered 2023-07-12: 2 mg via INTRAVENOUS

## 2023-07-12 MED ORDER — MIDAZOLAM HCL 2 MG/2ML IJ SOLN
INTRAMUSCULAR | Status: AC
  Filled 2023-07-12: qty 2

## 2023-07-12 MED ORDER — MIDAZOLAM HCL 2 MG/2ML IJ SOLN
INTRAMUSCULAR | Status: AC
Start: 2023-07-12 — End: ?
  Filled 2023-07-12: qty 2

## 2023-07-12 MED ORDER — DEXAMETHASONE SODIUM PHOSPHATE 4 MG/ML IJ SOLN
INTRAMUSCULAR | Status: DC | PRN
  Administered 2023-07-12: 8 mg via INTRAVENOUS

## 2023-07-12 MED ORDER — CHLORHEXIDINE GLUCONATE 4 % EX SOLN: 60.0000 mL | Freq: Once | CUTANEOUS | Status: DC

## 2023-07-12 MED ORDER — HYDROMORPHONE HCL 1 MG/ML IJ SOLN
INTRAMUSCULAR | Status: AC
  Filled 2023-07-12: qty 0.5

## 2023-07-12 MED ORDER — LACTATED RINGERS IV SOLN: INTRAVENOUS | Status: DC

## 2023-07-12 MED ORDER — MEPERIDINE HCL 25 MG/ML IJ SOLN: 6.2500 mg | INTRAMUSCULAR | Status: DC | PRN

## 2023-07-12 MED ORDER — CEFAZOLIN SODIUM-DEXTROSE 2-4 GM/100ML-% IV SOLN
INTRAVENOUS | Status: AC
  Filled 2023-07-12: qty 100

## 2023-07-12 MED ORDER — SODIUM CHLORIDE 0.9 % IV SOLN
12.5000 mg | INTRAVENOUS | Status: DC | PRN
  Filled 2023-07-12: qty 0.5

## 2023-07-12 MED ORDER — KETOROLAC TROMETHAMINE 30 MG/ML IJ SOLN
INTRAMUSCULAR | Status: AC
  Filled 2023-07-12: qty 1

## 2023-07-12 MED ORDER — OXYCODONE HCL 5 MG PO TABS
5.0000 mg | ORAL_TABLET | Freq: Once | ORAL | Status: AC | PRN
  Administered 2023-07-12: 5 mg via ORAL

## 2023-07-12 MED ORDER — ONDANSETRON HCL 4 MG/2ML IJ SOLN
INTRAMUSCULAR | Status: AC
  Filled 2023-07-12: qty 2

## 2023-07-12 SURGICAL SUPPLY — 75 items
ANCHOR SUT FBRTK 1.3 SUTTAP (Anchor) IMPLANT
BANDAGE ESMARK 6X9 LF (GAUZE/BANDAGES/DRESSINGS) IMPLANT
BIT DRILL 2.5 CANN STRL (BIT) IMPLANT
BLADE SURG 15 STRL LF DISP TIS (BLADE) ×2 IMPLANT
BNDG ELASTIC 4INX 5YD STR LF (GAUZE/BANDAGES/DRESSINGS) ×1 IMPLANT
BNDG ELASTIC 6X10 VLCR STRL LF (GAUZE/BANDAGES/DRESSINGS) ×1 IMPLANT
BNDG GAUZE DERMACEA FLUFF 4 (GAUZE/BANDAGES/DRESSINGS) ×1 IMPLANT
BOOT STEPPER DURA LG (SOFTGOODS) IMPLANT
BOOT STEPPER DURA MED (SOFTGOODS) IMPLANT
BOOT STEPPER DURA SM (SOFTGOODS) IMPLANT
BOOT STEPPER DURA XLG (SOFTGOODS) IMPLANT
BURR OVAL 8 FLU 4.0X13 (MISCELLANEOUS) IMPLANT
CHLORAPREP W/TINT 26 (MISCELLANEOUS) ×1 IMPLANT
COVER BACK TABLE 60X90IN (DRAPES) ×1 IMPLANT
CUFF TRNQT CYL 34X4.125X (TOURNIQUET CUFF) IMPLANT
DISSECTOR 3.8MM X 13CM (MISCELLANEOUS) IMPLANT
DRAPE EXTREMITY T 121X128X90 (DISPOSABLE) ×1 IMPLANT
DRAPE OEC MINIVIEW 54X84 (DRAPES) IMPLANT
DRAPE U-SHAPE 47X51 STRL (DRAPES) ×1 IMPLANT
DRSG MEPITEL 4X7.2 (GAUZE/BANDAGES/DRESSINGS) ×1 IMPLANT
ELECTRODE REM PT RTRN 9FT ADLT (ELECTROSURGICAL) ×1 IMPLANT
EXCALIBUR 3.8MM X 13CM (MISCELLANEOUS) IMPLANT
GAUZE PAD ABD 8X10 STRL (GAUZE/BANDAGES/DRESSINGS) ×1 IMPLANT
GAUZE SPONGE 4X4 12PLY STRL (GAUZE/BANDAGES/DRESSINGS) ×1 IMPLANT
GLOVE BIOGEL PI IND STRL 8 (GLOVE) ×2 IMPLANT
GLOVE SURG SS PI 7.5 STRL IVOR (GLOVE) ×1 IMPLANT
GOWN STRL REUS W/ TWL LRG LVL3 (GOWN DISPOSABLE) ×2 IMPLANT
GUIDEWIRE ORTH 157X1.6XTROC (WIRE) IMPLANT
KIT FIBERTAK DX 1.6 DISP (KITS) IMPLANT
NDL HYPO 22X1.5 SAFETY MO (MISCELLANEOUS) IMPLANT
NDL HYPO 25X1 1.5 SAFETY (NEEDLE) IMPLANT
NDL SUT 6 .5 CRC .975X.05 MAYO (NEEDLE) IMPLANT
NEEDLE HYPO 22X1.5 SAFETY MO (MISCELLANEOUS) IMPLANT
NEEDLE HYPO 25X1 1.5 SAFETY (NEEDLE) IMPLANT
NS IRRIG 1000ML POUR BTL (IV SOLUTION) ×1 IMPLANT
PACK ARTHROSCOPY DSU (CUSTOM PROCEDURE TRAY) ×1 IMPLANT
PACK BASIN DAY SURGERY FS (CUSTOM PROCEDURE TRAY) ×1 IMPLANT
PAD CAST 4YDX4 CTTN HI CHSV (CAST SUPPLIES) ×1 IMPLANT
PADDING CAST COTTON 6X4 STRL (CAST SUPPLIES) IMPLANT
PENCIL SMOKE EVACUATOR (MISCELLANEOUS) ×1 IMPLANT
PLATE LOCK THIRD TUBULAR 4H (Plate) IMPLANT
SANITIZER HAND PURELL FF 515ML (MISCELLANEOUS) ×1 IMPLANT
SCOPE NANONDL 125 (MISCELLANEOUS) IMPLANT
SCOPE NANONEEDLE 125 (MISCELLANEOUS) ×1 IMPLANT
SCREW LO-PRO TI 3.5X16MM (Screw) IMPLANT
SCREW LOCK 16X3.5XNS LF TI (Screw) IMPLANT
SET IRRIG Y TYPE TUR BLADDER L (SET/KITS/TRAYS/PACK) ×1 IMPLANT
SHAVER DISSECTOR 3.0 (BURR) IMPLANT
SHAVER SABRE 2.0 (BURR) IMPLANT
SHEATH NANONDL HIGHFLOW 125 (SHEATH) IMPLANT
SHEATH NANONEEDLE HIGHFLOW 125 (SHEATH) ×1 IMPLANT
SHEET MEDIUM DRAPE 40X70 STRL (DRAPES) ×1 IMPLANT
SLEEVE SCD COMPRESS KNEE MED (STOCKING) ×1 IMPLANT
SOL .9 NS 3000ML IRR UROMATIC (IV SOLUTION) ×1 IMPLANT
SPLINT FIBERGLASS 4X30 (CAST SUPPLIES) IMPLANT
SPLINT PLASTER CAST FAST 5X30 (CAST SUPPLIES) IMPLANT
SPONGE T-LAP 18X18 ~~LOC~~+RFID (SPONGE) ×1 IMPLANT
STOCKINETTE 6 STRL (DRAPES) ×1 IMPLANT
STRAP ANKLE FOOT DISTRACTOR (ORTHOPEDIC SUPPLIES) IMPLANT
SUCTION TUBE FRAZIER 10FR DISP (SUCTIONS) ×1 IMPLANT
SUT ETHILON 2 0 FS 18 (SUTURE) ×1 IMPLANT
SUT ETHILON 2 0 PS N (SUTURE) ×1 IMPLANT
SUT MNCRL AB 3-0 PS2 18 (SUTURE) IMPLANT
SUT VIC AB 2-0 CT1 TAPERPNT 27 (SUTURE) IMPLANT
SUT VIC AB 3-0 SH 27X BRD (SUTURE) IMPLANT
SUT VICRYL 0 SH 27 (SUTURE) IMPLANT
SYNDESMOSIS TIGHTROPE XP (Orthopedic Implant) IMPLANT
SYR BULB EAR ULCER 3OZ GRN STR (SYRINGE) ×1 IMPLANT
SYR CONTROL 10ML LL (SYRINGE) IMPLANT
TOWEL GREEN STERILE FF (TOWEL DISPOSABLE) ×1 IMPLANT
TUBE CONNECTING 20X1/4 (TUBING) IMPLANT
TUBING ARTHROSCOPY IRRIG 16FT (MISCELLANEOUS) ×1 IMPLANT
UNDERPAD 30X36 HEAVY ABSORB (UNDERPADS AND DIAPERS) ×1 IMPLANT
WAND ABLATOR APOLLO I90 (BUR) IMPLANT
YANKAUER SUCT BULB TIP NO VENT (SUCTIONS) IMPLANT

## 2023-07-12 NOTE — H&P (Signed)

## 2023-07-12 NOTE — Anesthesia Preprocedure Evaluation (Signed)
 Anesthesia Evaluation  Patient identified by MRN, date of birth, ID band Patient awake    Reviewed: Allergy & Precautions, H&P , NPO status , Patient's Chart, lab work & pertinent test results  Airway Mallampati: II  TM Distance: >3 FB Neck ROM: Full    Dental no notable dental hx.    Pulmonary sleep apnea    Pulmonary exam normal breath sounds clear to auscultation       Cardiovascular hypertension, Pt. on medications negative cardio ROS Normal cardiovascular exam Rhythm:Regular Rate:Normal     Neuro/Psych negative neurological ROS  negative psych ROS   GI/Hepatic negative GI ROS, Neg liver ROS,,,  Endo/Other    Class 3 obesity  Renal/GU negative Renal ROS  negative genitourinary   Musculoskeletal negative musculoskeletal ROS (+)    Abdominal  (+) + obese  Peds negative pediatric ROS (+)  Hematology negative hematology ROS (+)   Anesthesia Other Findings   Reproductive/Obstetrics negative OB ROS                             Anesthesia Physical Anesthesia Plan  ASA: 3  Anesthesia Plan: General   Post-op Pain Management: Regional block* and Minimal or no pain anticipated   Induction: Intravenous  PONV Risk Score and Plan: 2 and Ondansetron, Midazolam and Treatment may vary due to age or medical condition  Airway Management Planned: LMA  Additional Equipment:   Intra-op Plan:   Post-operative Plan: Extubation in OR  Informed Consent: I have reviewed the patients History and Physical, chart, labs and discussed the procedure including the risks, benefits and alternatives for the proposed anesthesia with the patient or authorized representative who has indicated his/her understanding and acceptance.     Dental advisory given  Plan Discussed with: CRNA  Anesthesia Plan Comments:        Anesthesia Quick Evaluation

## 2023-07-12 NOTE — Progress Notes (Signed)
Assisted Dr. Miller with left, popliteal, ultrasound guided block. Side rails up, monitors on throughout procedure. See vital signs in flow sheet. Tolerated Procedure well. 

## 2023-07-12 NOTE — Anesthesia Procedure Notes (Signed)
 Anesthesia Regional Block: Popliteal block   Pre-Anesthetic Checklist: , timeout performed,  Correct Patient, Correct Site, Correct Laterality,  Correct Procedure, Correct Position, site marked,  Risks and benefits discussed,  Surgical consent,  Pre-op evaluation,  At surgeon's request and post-op pain management  Laterality: Left  Prep: chloraprep       Needles:  Injection technique: Single-shot  Needle Type: Stimiplex     Needle Length: 9cm  Needle Gauge: 21     Additional Needles:   Procedures:,,,, ultrasound used (permanent image in chart),,    Narrative:  Start time: 07/12/2023 9:06 AM End time: 07/12/2023 9:11 AM Injection made incrementally with aspirations every 5 mL.  Performed by: Personally  Anesthesiologist: Earvin Goldberg, MD

## 2023-07-12 NOTE — Anesthesia Procedure Notes (Signed)
 Procedure Name: LMA Insertion Date/Time: 07/12/2023 9:32 AM  Performed by: Elvia Hammans, CRNAPre-anesthesia Checklist: Patient identified, Emergency Drugs available, Suction available and Patient being monitored Patient Re-evaluated:Patient Re-evaluated prior to induction Oxygen Delivery Method: Circle system utilized Preoxygenation: Pre-oxygenation with 100% oxygen Induction Type: IV induction Ventilation: Mask ventilation without difficulty LMA: LMA inserted LMA Size: 5.0 Number of attempts: 1 Placement Confirmation: positive ETCO2 Tube secured with: Tape Dental Injury: Teeth and Oropharynx as per pre-operative assessment

## 2023-07-12 NOTE — Transfer of Care (Signed)
 Immediate Anesthesia Transfer of Care Note  Patient: Dennis Perez [facility]  Procedure(s) Performed: ARTHROSCOPY, ANKLE WITH DEBRIDEMENT (Left: Ankle) REPAIR, SYNDESMOSIS, ANKLE (Left: Ankle) REPAIR, TENDON, FLEXOR (Left: Ankle)  Patient Location: PACU  Anesthesia Type:General  Level of Consciousness: awake, drowsy, and patient cooperative  Airway & Oxygen Therapy: Patient Spontanous Breathing and Patient connected to face mask oxygen  Post-op Assessment: Report given to RN and Post -op Vital signs reviewed and stable  Post vital signs: Reviewed and stable  Last Vitals:  Vitals Value Taken Time  BP 150/110 07/12/23 1052  Temp 36.3 C 07/12/23 1052  Pulse 80 07/12/23 1056  Resp 12 07/12/23 1056  SpO2 99 % 07/12/23 1056  Vitals shown include unfiled device data.  Last Pain:  Vitals:   07/12/23 0804  TempSrc: Tympanic  PainSc: 0-No pain      Patients Stated Pain Goal: 7 (07/12/23 0804)  Complications: No notable events documented.

## 2023-07-12 NOTE — Anesthesia Postprocedure Evaluation (Signed)
 Anesthesia Post Note  Patient: Dennis Perez  Procedure(s) Performed: ARTHROSCOPY, ANKLE WITH DEBRIDEMENT (Left: Ankle) REPAIR, SYNDESMOSIS, ANKLE (Left: Ankle) REPAIR, TENDON, FLEXOR (Left: Ankle)     Patient location during evaluation: PACU Anesthesia Type: General Level of consciousness: awake and alert Pain management: pain level controlled Vital Signs Assessment: post-procedure vital signs reviewed and stable Respiratory status: spontaneous breathing, nonlabored ventilation and respiratory function stable Cardiovascular status: blood pressure returned to baseline and stable Postop Assessment: no apparent nausea or vomiting Anesthetic complications: no   No notable events documented.  Last Vitals:  Vitals:   07/12/23 1130 07/12/23 1143  BP: (!) 142/83 (!) 162/98  Pulse: 77 77  Resp: 11 14  Temp:  (!) 36.2 C  SpO2: 92% 94%    Last Pain:  Vitals:   07/12/23 1147  TempSrc:   PainSc: 7                  Earvin Goldberg

## 2023-07-12 NOTE — Op Note (Signed)
 07/12/2023  1:09 PM   PATIENT: Dennis Perez  48 y.o. male  MRN: [id number]   PRE-OPERATIVE DIAGNOSIS:   Left ankle recurrent instability   POST-OPERATIVE DIAGNOSIS:   Same   PROCEDURE: 1] Left ankle arthroscopic assisted debridement 2] Left open lateral ankle ligament stabilization Gelene Kelly) 3] Left peroneus brevis debridement with tenosynovectomy 4] Left peroneus longus debridement with tenosynovectomy 5] Left ankle open reduction internal fixation of syndesmosis   SURGEON:  Ali Ink, MD   ASSISTANT: None   ANESTHESIA: General, regional   EBL: Minimal   TOURNIQUET:    Total Tourniquet Time Documented: Thigh (Left) - 61 minutes Total: Thigh (Left) - 61 minutes    COMPLICATIONS: None apparent   DISPOSITION: Extubated, awake and stable to recovery.   INDICATION FOR PROCEDURE: The patient presented with above diagnosis.  We discussed the diagnosis, alternative treatment options, risks and benefits of the above surgical intervention, as well as alternative non-operative treatments. All questions/concerns were addressed and the patient/family demonstrated appropriate understanding of the diagnosis, the procedure, the postoperative course, and overall prognosis. The patient wished to proceed with surgical intervention and signed an informed surgical consent as such, in each others presence prior to surgery.   PROCEDURE IN DETAIL: After preoperative consent was obtained and the correct operative site was identified, the patient was brought to the operating room supine on stretcher and transferred onto operating table. General anesthesia was induced. Preoperative antibiotics were administered. Surgical timeout was taken. The patient was then positioned supine with an ipsilateral hip bump. The operative lower extremity was prepped and draped in standard sterile fashion with a tourniquet around the thigh. The extremity was exsanguinated and the tourniquet  was inflated to 275 mmHg.  Routine evaluation of the ankle joint demonstrated gross lateral laxity with drawer testing. Full dorsiflexion as well as plantarflexion was possible.   We began by insufflating the ankle joint thru anteromedial approach. The anteromedial portal was carefully established medial to the tibialis anterior tendon. The arthroscopic trochar with blunt was inserted and then camera placed. There was excellent visualization of the joint and routine diagnostic ankle arthroscopy was performed. Of note, there was mild synovitis throughout the joint noted. Mild chondral changes in the tibiotalar joint surfaces. No loose bodies were encountered and no anterior ankle impingement was identified on max dorsiflexion. The deltoid and syndesmosis ligaments were stressed and noted to be stable. Arthroscopic assisted debridement of the ankle joint was performed.    A standard curvilinear approach was made over the lateral ankle ligament complex and the distal lateral malleolus.    We began by windowing the approach posteriorly to access the peroneal tendons. The sheath was incised and tenosynovial fluid was readily evacuated. We then sequentially evaluated both the peroneus longus and brevis tendons with no tearing noted. We did note extensive tenosynovitis that was debrided thoroughly. There was no instability of peroneal tendons to intraoperative stress testing. The peroneal tendon sheath was closed with vicryl.    Dissection was the carried down to the level of the lateral ankle ligament complex. We sharply incised the capsule and the complex just distal to the tip of the fibula leaving a small cuff of tissue for repair after advancement. The proximal flap was elevated carefully off the lateral malleolus. We then used a rongeur to roughen the distal lateral malleolus. Two Arthrex FiberTak anchors were implanted using standard technique in the anatomic footprints of the ATFL and CFL ligaments. These  were verified by manual stress to be well  seated within bone. The suture needles were sequentially passed through the distal flap, tied, and then brought back proximally into the proximal flap were they were then tied.The foot was held in eversion throughout to set appropriate tension and protect the repair. Intraoperative ankle testing demonstrated improved stability of the newly reconstructed lateral ankle ligament complex.  A manual external rotation stress radiograph was obtained and demonstrated widening of the ankle mortise. Given this intraoperative finding as well as preoperative subluxation, it was decided to reduce and fix the syndesmosis. Therefore a suture fixation system (TightRope device) was implanted through an implanted fibula plate in appropriate fashion to fix the syndesmosis. Anchor/button position was verified along anteromedial tibial cortex by fluoroscopy. A repeat stress radiograph showed complete stability of the ankle mortise to testing.   The surgical sites were thoroughly irrigated. The tourniquet was deflated and hemostasis achieved. Betadine and vancomycin powder were applied. The deep layers were closed using 2-0 vicryl. The skin was closed without tension using 2-0 nylon suture.    The leg was cleaned with saline and sterile dressings with gauze were applied. A well padded bulky wrap in CAM boot was applied. The patient was awakened from anesthesia and transported to the recovery room in stable condition.    FOLLOW UP PLAN: -transfer to PACU, then home -TDWB LLE operative extremity, maximum elevation -maintain boot until follow up -DVT ppx: Aspirin 81 mg twice daily while NWB -follow up as outpatient within 7-10 days for wound check -sutures out in 2-3 weeks in outpatient office   RADIOGRAPHS: AP, lateral, oblique and stress radiographs of the left ankle were obtained intraoperatively. These showed interval reduction and fixation of the syndesmosis. Manual stress  radiographs were taken and the joints were noted to be stable following fixation. All hardware is appropriately positioned and of the appropriate lengths. No other acute injuries are noted.   Ali Ink [facility]

## 2023-07-13 ENCOUNTER — Encounter (HOSPITAL_BASED_OUTPATIENT_CLINIC_OR_DEPARTMENT_OTHER): Payer: Self-pay | Admitting: Orthopaedic Surgery

## 2023-10-03 ENCOUNTER — Other Ambulatory Visit (HOSPITAL_COMMUNITY): Payer: Self-pay

## 2023-10-03 DIAGNOSIS — N200 Calculus of kidney: Secondary | ICD-10-CM

## 2023-10-04 ENCOUNTER — Ambulatory Visit (HOSPITAL_COMMUNITY)
Admission: RE | Admit: 2023-10-04 | Discharge: 2023-10-04 | Disposition: A | Discharge status: Home / Self Care | Source: Ambulatory Visit

## 2023-10-04 DIAGNOSIS — N200 Calculus of kidney: Secondary | ICD-10-CM | POA: Insufficient documentation

## 2023-10-23 ENCOUNTER — Encounter (INDEPENDENT_AMBULATORY_CARE_PROVIDER_SITE_OTHER): Payer: Self-pay | Admitting: *Deleted

## 2023-11-02 ENCOUNTER — Ambulatory Visit (HOSPITAL_BASED_OUTPATIENT_CLINIC_OR_DEPARTMENT_OTHER): Payer: Self-pay | Admitting: General Surgery

## 2023-12-20 ENCOUNTER — Other Ambulatory Visit (HOSPITAL_COMMUNITY): Payer: Self-pay | Admitting: Nephrology

## 2023-12-20 DIAGNOSIS — N182 Chronic kidney disease, stage 2 (mild): Secondary | ICD-10-CM

## 2023-12-20 DIAGNOSIS — N200 Calculus of kidney: Secondary | ICD-10-CM
